# Patient Record
Sex: Female | Born: 2005 | Race: Black or African American | Hispanic: No | Marital: Single | Smoking: Never smoker
Health system: Southern US, Community
[De-identification: ages and names within clinical notes are randomized; demographics above are authoritative.]

## PROBLEM LIST (undated history)

## (undated) DIAGNOSIS — F419 Anxiety disorder, unspecified: Secondary | ICD-10-CM

## (undated) DIAGNOSIS — F32A Depression, unspecified: Secondary | ICD-10-CM

## (undated) HISTORY — DX: Anxiety disorder, unspecified: F41.9

## (undated) HISTORY — DX: Depression, unspecified: F32.A

---
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
## 2023-05-01 ENCOUNTER — Other Ambulatory Visit: Payer: Self-pay | Admitting: Pediatrics

## 2023-05-04 ENCOUNTER — Other Ambulatory Visit: Payer: Self-pay | Admitting: Pediatrics

## 2023-07-14 ENCOUNTER — Ambulatory Visit (INDEPENDENT_AMBULATORY_CARE_PROVIDER_SITE_OTHER): Payer: MEDICAID | Admitting: Pediatrics

## 2023-07-14 VITALS — BP 134/76 | Ht 67.32 in | Wt 263.8 lb

## 2023-07-14 DIAGNOSIS — R7309 Other abnormal glucose: Secondary | ICD-10-CM | POA: Diagnosis not present

## 2023-07-14 DIAGNOSIS — L299 Pruritus, unspecified: Secondary | ICD-10-CM

## 2023-07-14 DIAGNOSIS — E669 Obesity, unspecified: Secondary | ICD-10-CM | POA: Diagnosis not present

## 2023-07-14 DIAGNOSIS — I10 Essential (primary) hypertension: Secondary | ICD-10-CM | POA: Diagnosis not present

## 2023-07-14 MED ORDER — AMLODIPINE BESYLATE 5 MG PO TABS
10.0000 mg | ORAL_TABLET | Freq: Every day | ORAL | 1 refills | Status: DC
Start: 2023-07-14 — End: 2023-10-15

## 2023-07-14 MED ORDER — TRIAMCINOLONE ACETONIDE 0.025 % EX OINT: 1.0000 | TOPICAL_OINTMENT | Freq: Two times a day (BID) | CUTANEOUS | 6 refills | Status: DC

## 2023-07-14 MED ORDER — CETIRIZINE HCL 10 MG PO TABS
10.0000 mg | ORAL_TABLET | Freq: Every day | ORAL | 11 refills | Status: AC
Start: 2023-07-14 — End: ?

## 2023-07-14 NOTE — Progress Notes (Unsigned)
    Subjective:    Sherri [name] is a 17 y.o. female accompanied by {Person; guardian:61} presenting to the clinic today with a chief c/o of      Review of Systems     Objective:   Physical Exam .BP 134/76 (BP Location: Right Arm, Patient Position: Sitting, Cuff Size: Normal)   Ht 5' 7.32" (1.71 m)   Wt 263 lb 12.8 oz (119.7 kg)   BMI 40.92 kg/m         Assessment & Plan:  There are no diagnoses linked to this encounter.   Time spent reviewing chart in preparation for visit:  *** minutes Time spent face-to-face with patient: *** minutes Time spent not face-to-face with patient for documentation and care coordination on date of service: *** minutes  No follow-ups on file.  Tobey Bride, MD 07/14/2023 3:19 PM

## 2023-07-15 ENCOUNTER — Encounter: Payer: Self-pay | Admitting: Pediatrics

## 2023-07-15 DIAGNOSIS — R7309 Other abnormal glucose: Secondary | ICD-10-CM | POA: Insufficient documentation

## 2023-07-15 DIAGNOSIS — I10 Essential (primary) hypertension: Secondary | ICD-10-CM | POA: Insufficient documentation

## 2023-08-03 ENCOUNTER — Encounter: Payer: Self-pay | Admitting: *Deleted

## 2023-08-03 NOTE — Progress Notes (Signed)
Pt attended 07/11/23 screening event where her b/p was 135/91 on recheck. At the event, the pt noted her PCP was Dr. Wynetta Emery, that she had insurance, and that she had transportation insecurities. Chart review does not reveal any [facility]-visible health care encounter, including PCP. In-basket message sent to Dr. Wynetta Emery since pt's b/p was elevated but health equity team member unable to contact pt by phone to verify here PCP access status- VM left for pt. Response from PCP 08/04/23: "[name], MD   Thank you so much for the update. We are following her up in clinic for BP management & she has an upcoming appt." Pt had f/u appt with PCP office on 08/09/23, where her b/p was 132/72 and pt has future appt with PCP on 09/09/2023. No additional health equity team support indicated at this time.

## 2023-08-09 ENCOUNTER — Encounter: Payer: Self-pay | Admitting: Pediatrics

## 2023-08-09 ENCOUNTER — Ambulatory Visit (INDEPENDENT_AMBULATORY_CARE_PROVIDER_SITE_OTHER): Payer: MEDICAID | Admitting: Pediatrics

## 2023-08-09 VITALS — BP 132/72 | Ht 67.64 in | Wt 264.8 lb

## 2023-08-09 DIAGNOSIS — I10 Essential (primary) hypertension: Secondary | ICD-10-CM | POA: Diagnosis not present

## 2023-08-09 DIAGNOSIS — L309 Dermatitis, unspecified: Secondary | ICD-10-CM

## 2023-08-09 DIAGNOSIS — R7309 Other abnormal glucose: Secondary | ICD-10-CM

## 2023-08-09 MED ORDER — TRIAMCINOLONE ACETONIDE 0.1 % EX OINT
1.0000 | TOPICAL_OINTMENT | Freq: Two times a day (BID) | CUTANEOUS | 1 refills | Status: AC
Start: 2023-08-09 — End: ?

## 2023-08-09 NOTE — Patient Instructions (Signed)

## 2023-08-09 NOTE — Progress Notes (Signed)
Subjective:    Sherri [name] is a 17 y.o. old female here with her mother for Follow-up (Dermatology referral) .    Interpreter present: no  HPI  Sherri [name] has multiple problems and is here for follow up. Eczema - She feels is getting worse. She does sometimes use triamcinolone but does not feel it works. She would like a referral to dermatology. Also still taking zyrtec.  Hypertension - is taking 10 mg amlodipine in the morning. Not having any issues with this medication.  Was previously in college in [place] but is now here for a little while as she postponed going back to school.  Patient Active Problem List   Diagnosis Date Noted   Hypertension 07/15/2023   Elevated hemoglobin A1c 07/15/2023   Elevated blood pressure reading 04/06/2022   Nexplanon in place 10/13/2021   Depression 06/23/2020   Hyperhidrosis 06/07/2019   Intermittent palpitations 06/07/2019   Dysmenorrhea 03/28/2018   Social discord 03/23/2016   Attention deficit hyperactivity disorder (ADHD) 03/23/2016   Adjustment disorder 03/23/2016   Acne 04/18/2014   Obesity without serious comorbidity 04/17/2014    PE up to date?: yes  History and Problem List: Sherri [name] has Obesity without serious comorbidity; Acne; Social discord; Attention deficit hyperactivity disorder (ADHD); Adjustment disorder; Dysmenorrhea; Hyperhidrosis; Intermittent palpitations; Depression; Nexplanon in place; Elevated blood pressure reading; Hypertension; and Elevated hemoglobin A1c on their problem list.  Sherri [name]  has a past medical history of Anxiety and Depression.  Immunizations needed: none     Objective:    BP 132/72 (BP Location: Left Arm, Patient Position: Sitting, Cuff Size: Normal)   Ht 5' 7.64" (1.718 m)   Wt 264 lb 12.8 oz (120.1 kg)   BMI 40.69 kg/m    General Appearance:   alert, oriented, no acute distress  HENT: Normocephalic, EOMI, PERRLA, conjunctiva clear. Left TM clear, right TM clear.  Mouth:   Oropharynx,  palate, tongue and gums normal. MMM.  Neck:   Supple, no adenopathy.  Lungs:   Clear to auscultation bilaterally. No wheezes, crackles. Normal WOB.  Heart:   Regular rate and regular rhythm, no m/r/g. Cap refill <2sec  Abdomen:   Soft, non-tender, non-distended, normal bowel sounds. No masses, or organomegaly.  Musculoskeletal:   Tone and strength strong and symmetrical. All extremities full range of motion.      Skin/Hair/Nails:   Skin warm and dry. No bruises, lesions. Generalized dry skin. Eczema on b/l legs.        Assessment and Plan:     Sherri [name] was seen today for Follow-up (Dermatology referral) .   Problem List Items Addressed This Visit       Cardiovascular and Mediastinum   Hypertension - Primary -three BP checks today. Lowest 132/72, goal of 120/80. Will recheck in 4 weeks. Can consider additional medication if remains elevated but could also transition to adult medicine provider. Reassuring she is not endorsing chest pain, palpitations, headaches.    Relevant Orders   TSH + free T4 (Completed)   Comprehensive metabolic panel (Completed)     Other   Elevated hemoglobin A1c, obesity -will recheck today given previously elevated to 5.8. Reviewed DASH diet.    Relevant Orders   Hemoglobin A1c (Completed)   Comprehensive metabolic panel (Completed)   Other Visit Diagnoses     Eczema, unspecified type    -open to trying stronger ointment. Can consider Derm referral at following appointment if this does not work   Relevant Medications   triamcinolone ointment (KENALOG) 0.1 %  Return in about 4 weeks (around 09/06/2023) for follow up lab discussion.  French Ana, MD

## 2023-08-10 LAB — COMPREHENSIVE METABOLIC PANEL
AG Ratio: 1.6 (calc) (ref 1.0–2.5)
ALT: 20 U/L (ref 5–32)
AST: 18 U/L (ref 12–32)
Albumin: 4.3 g/dL (ref 3.6–5.1)
Alkaline phosphatase (APISO): 59 U/L (ref 36–128)
BUN/Creatinine Ratio: 14 (calc) (ref 6–22)
BUN: 16 mg/dL (ref 7–20)
CO2: 26 mmol/L (ref 20–32)
Calcium: 9.3 mg/dL (ref 8.9–10.4)
Chloride: 106 mmol/L (ref 98–110)
Creat: 1.12 mg/dL — ABNORMAL HIGH (ref 0.50–0.96)
Globulin: 2.7 g/dL (ref 2.0–3.8)
Glucose, Bld: 108 mg/dL — ABNORMAL HIGH (ref 65–99)
Potassium: 4.4 mmol/L (ref 3.8–5.1)
Sodium: 139 mmol/L (ref 135–146)
Total Bilirubin: 0.2 mg/dL (ref 0.2–1.1)
Total Protein: 7 g/dL (ref 6.3–8.2)

## 2023-08-10 LAB — HEMOGLOBIN A1C
Hgb A1c MFr Bld: 5.9 %{Hb} — ABNORMAL HIGH (ref <5.7)
Mean Plasma Glucose: 123 mg/dL
eAG (mmol/L): 6.8 mmol/L

## 2023-08-10 LAB — TSH+FREE T4: TSH W/REFLEX TO FT4: 1.47 m[IU]/L

## 2023-08-14 NOTE — Addendum Note (Signed)
Addended by: Tobey Bride V on: 08/14/2023 01:09 PM   Modules accepted: Level of Service

## 2023-08-16 NOTE — Progress Notes (Signed)
Messaged patient regarding elevated Hgb A1C and Creatinine. Will follow up at next appt in four weeks.

## 2023-09-09 ENCOUNTER — Ambulatory Visit: Payer: MEDICAID | Admitting: Pediatrics

## 2023-09-13 ENCOUNTER — Telehealth (INDEPENDENT_AMBULATORY_CARE_PROVIDER_SITE_OTHER): Payer: MEDICAID | Admitting: Pediatrics

## 2023-09-13 DIAGNOSIS — I10 Essential (primary) hypertension: Secondary | ICD-10-CM

## 2023-09-13 DIAGNOSIS — R7309 Other abnormal glucose: Secondary | ICD-10-CM | POA: Diagnosis not present

## 2023-09-13 NOTE — Progress Notes (Signed)
 Virtual Visit via Video Note  I connected with Sherri [name]   on 09/13/23 at  3:15 PM EST by a video enabled telemedicine application and verified that I am speaking with the correct person using two identifiers.   Location of patient/parent: home   I discussed the limitations of evaluation and management by telemedicine and the availability of in person appointments.  I discussed that the purpose of this telehealth visit is to provide medical care while limiting exposure to the novel coronavirus.    I advised the patient  that by engaging in this telehealth visit, they consent to the provision of healthcare.  Additionally, they authorize for the patient's insurance to be billed for the services provided during this telehealth visit.  They expressed understanding and agreed to proceed.  Reason for visit: follow up on BP & labs  History of Present Illness:  Pt has h/o elevated BP & is on Amlodipine  10 mg daily. Her BP last visit was 132/72. She however has not had BP check since then. Se also had elevated HgB A1c at 5.9 Normal TFTs. CMP showed mildly elevated creatinine at 1.12. Pt is asymptomatic with no headaches, no abdominal pain, no sleep issues. Pt is unable to come in for in person visit as leaving for college. She does not have health insurance in college but plans to get a job with health insurance. Several discussions regarding need for patient to transition to adult care.    Observations/Objective: No acute distress  Assessment and Plan: 18 yr old F with h/o essential hypertension Obesity with elevated HgB A1C in pre diabetes level. Borderline elevated creatinine  Follow Up Instructions:   Discussed continued use of Amlodipine  10 mg daily Follow DASH diet. Encouraged healthy lifestyle. Discussed importance of needing BP readings & to establish PCP in [place] . Can check local health department or [facility] if no insurance. Return for follow up when in [place].   I discussed the  assessment and treatment plan with the patient and/or parent/guardian. They were provided an opportunity to ask questions and all were answered. They agreed with the plan and demonstrated an understanding of the instructions.   They were advised to call back or seek an in-person evaluation in the emergency room if the symptoms worsen or if the condition fails to improve as anticipated.  Time spent reviewing chart in preparation for visit:  5 minutes Time spent face-to-face with patient: 20 minutes Time spent not face-to-face with patient for documentation and care coordination on date of service: 5 minutes  I was located at [facility] during this encounter.  Arthor LULLA Harris, MD

## 2023-09-13 NOTE — Patient Instructions (Signed)

## 2023-10-12 ENCOUNTER — Other Ambulatory Visit: Payer: Self-pay | Admitting: Pediatrics

## 2023-10-12 DIAGNOSIS — I10 Essential (primary) hypertension: Secondary | ICD-10-CM

## 2023-12-28 ENCOUNTER — Other Ambulatory Visit: Payer: Self-pay | Admitting: Pediatrics

## 2023-12-28 DIAGNOSIS — I10 Essential (primary) hypertension: Secondary | ICD-10-CM

## 2024-02-17 ENCOUNTER — Encounter: Payer: Self-pay | Admitting: Pediatrics

## 2024-03-01 ENCOUNTER — Other Ambulatory Visit: Payer: Self-pay | Admitting: Pediatrics

## 2024-03-01 DIAGNOSIS — I10 Essential (primary) hypertension: Secondary | ICD-10-CM
